# Patient Record
Sex: Female | Born: 1970 | Race: White | Hispanic: No | Marital: Single | Smoking: Never smoker
Health system: Southern US, Community
[De-identification: ages and names within clinical notes are randomized; demographics above are authoritative.]

## PROBLEM LIST (undated history)

## (undated) DIAGNOSIS — R87619 Unspecified abnormal cytological findings in specimens from cervix uteri: Secondary | ICD-10-CM

---
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
## 2014-01-17 DIAGNOSIS — R87619 Unspecified abnormal cytological findings in specimens from cervix uteri: Secondary | ICD-10-CM

## 2014-01-17 HISTORY — DX: Unspecified abnormal cytological findings in specimens from cervix uteri: R87.619

## 2018-06-20 DIAGNOSIS — M79672 Pain in left foot: Secondary | ICD-10-CM | POA: Diagnosis not present

## 2018-07-25 DIAGNOSIS — J3501 Chronic tonsillitis: Secondary | ICD-10-CM | POA: Insufficient documentation

## 2018-08-27 DIAGNOSIS — J3501 Chronic tonsillitis: Secondary | ICD-10-CM | POA: Diagnosis not present

## 2018-10-09 ENCOUNTER — Other Ambulatory Visit: Payer: Self-pay

## 2018-10-11 ENCOUNTER — Other Ambulatory Visit: Payer: Self-pay

## 2018-10-11 ENCOUNTER — Encounter: Payer: Self-pay | Admitting: Obstetrics & Gynecology

## 2018-10-11 ENCOUNTER — Ambulatory Visit: Payer: BC Managed Care – PPO | Admitting: Obstetrics & Gynecology

## 2018-10-11 ENCOUNTER — Ambulatory Visit
Admission: RE | Admit: 2018-10-11 | Discharge: 2018-10-11 | Disposition: A | Discharge status: Home / Self Care | Payer: BC Managed Care – PPO | Source: Ambulatory Visit | Attending: Obstetrics & Gynecology | Admitting: Obstetrics & Gynecology

## 2018-10-11 ENCOUNTER — Other Ambulatory Visit (HOSPITAL_COMMUNITY)
Admission: RE | Admit: 2018-10-11 | Discharge: 2018-10-11 | Disposition: A | Discharge status: Home / Self Care | Payer: BC Managed Care – PPO | Source: Ambulatory Visit | Attending: Obstetrics & Gynecology | Admitting: Obstetrics & Gynecology

## 2018-10-11 ENCOUNTER — Other Ambulatory Visit: Payer: Self-pay | Admitting: Obstetrics & Gynecology

## 2018-10-11 VITALS — BP 110/84 | HR 80 | Temp 98.0°F | Ht 64.0 in | Wt 145.0 lb

## 2018-10-11 DIAGNOSIS — Z01419 Encounter for gynecological examination (general) (routine) without abnormal findings: Secondary | ICD-10-CM | POA: Diagnosis not present

## 2018-10-11 DIAGNOSIS — Z1231 Encounter for screening mammogram for malignant neoplasm of breast: Secondary | ICD-10-CM

## 2018-10-11 DIAGNOSIS — B977 Papillomavirus as the cause of diseases classified elsewhere: Secondary | ICD-10-CM | POA: Diagnosis not present

## 2018-10-11 DIAGNOSIS — Z124 Encounter for screening for malignant neoplasm of cervix: Secondary | ICD-10-CM | POA: Diagnosis not present

## 2018-10-11 DIAGNOSIS — Z0000 Encounter for general adult medical examination without abnormal findings: Secondary | ICD-10-CM | POA: Diagnosis not present

## 2018-10-11 DIAGNOSIS — R7989 Other specified abnormal findings of blood chemistry: Secondary | ICD-10-CM

## 2018-10-11 MED ORDER — HYDROCORTISONE (PERIANAL) 2.5 % EX CREA: TOPICAL_CREAM | Freq: Four times a day (QID) | CUTANEOUS | 1 refills | Status: DC

## 2018-10-11 NOTE — Progress Notes (Signed)
48 y.o. [id number] Domestic Partner White or Caucasian female here for new patient annual exam.  Moved to [place] from [place].  Parents and siblings are here in [place].  Daughter lives in [place] and is a sophomore.    She is still having cycles but they are now about five weeks between cycles.  Flow is heavier on the first day and then tapers off very quickly.    Does have hemorrhoids.  Uses witch hazel.  Had a colonoscopy due to hemorrhoid related symptoms.    Patient's last menstrual period was 10/04/2018 (exact date).          Sexually active: No.  The current method of family planning is abstinence.    Exercising: Yes.    some Smoker:  no  Health Maintenance: Pap:  2018 Normal  History of abnormal Pap:  Yes, HPV + ~2016 MMG:  2016 Normal  Colonoscopy:  ~2017  hemorrhoids  BMD:   Never TDaP:  Current  Screening Labs: fasting today   reports that she has never smoked. She has never used smokeless tobacco. She reports current alcohol use. She reports that she does not use drugs.  Past Medical History:  Diagnosis Date  . 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 No pertinent surgical history.  No current outpatient medications on file.   No current facility-administered medications for this visit.     Family History  Problem Relation Age of Onset  . Heart disease Mother   . Hypertension Mother   . Thyroid disease Mother     Review of Systems  Genitourinary:       Breast/ axilla pain on and off   All other systems reviewed and are negative.   Exam:   BP 110/84   Pulse 80   Temp 98 F (36.7 C) (Temporal)   Ht 5\' 4"  (1.626 m)   Wt 145 lb (65.8 kg)   LMP 10/04/2018 (Exact Date)   BMI 24.89 kg/m   Height:   Height: 5\' 4"  (162.6 cm)  Ht Readings from Last 3 Encounters:  10/11/18 5\' 4"  (1.626 m)    General appearance: alert, cooperative and appears stated age Head: Normocephalic, without obvious abnormality,  atraumatic Neck: no adenopathy, supple, symmetrical, trachea midline and thyroid normal to inspection and palpation Lungs: clear to auscultation bilaterally Breasts: normal appearance, no masses or tenderness Heart: regular rate and rhythm Abdomen: soft, non-tender; bowel sounds normal; no masses,  no organomegaly Extremities: extremities normal, atraumatic, no cyanosis or edema Skin: Skin color, texture, turgor normal. No rashes or lesions Lymph nodes: Cervical, supraclavicular, and axillary nodes normal. No abnormal inguinal nodes palpated Neurologic: Grossly normal   Pelvic: External genitalia:  no lesions              Urethra:  normal appearing urethra with no masses, tenderness or lesions              Bartholins and Skenes: normal                 Vagina: normal appearing vagina with normal color and discharge, no lesions              Cervix: no lesions              Pap taken: Yes.   Bimanual Exam:  Uterus:  normal size, contour, position, consistency, mobility, non-tender  Adnexa: normal adnexa and no mass, fullness, tenderness               Rectovaginal: Confirms               Anus:  normal sphincter tone, no lesions  Chaperone was present for exam.  A:  Well Woman with normal exam Perimenopausal Hemorrhoids   P:   Mammogram guidelines reviewed.  She will schedule a 3D MMG. pap smear with HR HPV CBC, CMP, Lipids, TSH, and Vit D obtained today Anusol HC 2.5% prn hemorrhoid pain/swelling Return annually or prn

## 2018-10-12 LAB — LIPID PANEL
Chol/HDL Ratio: 2.2 ratio (ref 0.0–4.4)
Cholesterol, Total: 165 mg/dL (ref 100–199)
HDL: 76 mg/dL (ref >39)
LDL Chol Calc (NIH): 79 mg/dL (ref 0–99)
Triglycerides: 47 mg/dL (ref 0–149)
VLDL Cholesterol Cal: 10 mg/dL (ref 5–40)

## 2018-10-12 LAB — VITAMIN D 25 HYDROXY (VIT D DEFICIENCY, FRACTURES): Vit D, 25-Hydroxy: 30.4 ng/mL (ref 30.0–100.0)

## 2018-10-12 LAB — CBC
Hematocrit: 40 % (ref 34.0–46.6)
Hemoglobin: 13.2 g/dL (ref 11.1–15.9)
MCH: 30.9 pg (ref 26.6–33.0)
MCHC: 33 g/dL (ref 31.5–35.7)
MCV: 94 fL (ref 79–97)
Platelets: 269 10*3/uL (ref 150–450)
RBC: 4.27 x10E6/uL (ref 3.77–5.28)
RDW: 11.3 % — ABNORMAL LOW (ref 11.7–15.4)
WBC: 3.1 10*3/uL — ABNORMAL LOW (ref 3.4–10.8)

## 2018-10-12 LAB — COMPREHENSIVE METABOLIC PANEL
ALT: 8 IU/L (ref 0–32)
AST: 17 IU/L (ref 0–40)
Albumin/Globulin Ratio: 1.5 (ref 1.2–2.2)
Albumin: 4.4 g/dL (ref 3.8–4.8)
Alkaline Phosphatase: 42 IU/L (ref 39–117)
BUN/Creatinine Ratio: 13 (ref 9–23)
BUN: 10 mg/dL (ref 6–24)
Bilirubin Total: 0.4 mg/dL (ref 0.0–1.2)
CO2: 24 mmol/L (ref 20–29)
Calcium: 9.1 mg/dL (ref 8.7–10.2)
Chloride: 102 mmol/L (ref 96–106)
Creatinine, Ser: 0.8 mg/dL (ref 0.57–1.00)
GFR calc Af Amer: 102 mL/min/{1.73_m2} (ref >59)
GFR calc non Af Amer: 88 mL/min/{1.73_m2} (ref >59)
Globulin, Total: 2.9 g/dL (ref 1.5–4.5)
Glucose: 80 mg/dL (ref 65–99)
Potassium: 4.1 mmol/L (ref 3.5–5.2)
Sodium: 138 mmol/L (ref 134–144)
Total Protein: 7.3 g/dL (ref 6.0–8.5)

## 2018-10-12 LAB — TSH: TSH: 0.038 u[IU]/mL — ABNORMAL LOW (ref 0.450–4.500)

## 2018-10-18 LAB — CYTOLOGY - PAP
Adequacy: ABSENT
Diagnosis: NEGATIVE
High risk HPV: NEGATIVE
Molecular Disclaimer: 56
Molecular Disclaimer: NORMAL

## 2018-10-21 NOTE — Addendum Note (Signed)
Addended by: Megan Salon on: 10/21/2018 11:31 PM   Modules accepted: Orders

## 2018-10-22 ENCOUNTER — Telehealth: Payer: Self-pay | Admitting: Obstetrics & Gynecology

## 2018-10-22 ENCOUNTER — Other Ambulatory Visit: Payer: Self-pay | Admitting: Obstetrics & Gynecology

## 2018-10-22 DIAGNOSIS — N6310 Unspecified lump in the right breast, unspecified quadrant: Secondary | ICD-10-CM

## 2018-10-22 NOTE — Telephone Encounter (Signed)
Spoke to [place] at [facility]. Result is pending patients previous MMG records.  [facility] info on [facility] on 10-12-18. Requested Elizabeth contact patient directly regarding result and additional/previous image information needed.   Routing to provider. Encounter closed.

## 2018-10-22 NOTE — Telephone Encounter (Signed)
Call to patient. Patient states she had her MMG done on 10-11-2018 at the [facility]. Patient states no one has called her in regard to MMG results. Report in Epic only showing "exam ended." RN advised would need to contact [facility] to follow up on MMG. Patient agreeable.

## 2018-10-22 NOTE — Telephone Encounter (Signed)
Patient is calling regarding mammogram results.

## 2018-10-24 ENCOUNTER — Ambulatory Visit
Admission: RE | Admit: 2018-10-24 | Discharge: 2018-10-24 | Disposition: A | Discharge status: Home / Self Care | Payer: BC Managed Care – PPO | Source: Ambulatory Visit | Attending: Obstetrics & Gynecology | Admitting: Obstetrics & Gynecology

## 2018-10-24 ENCOUNTER — Other Ambulatory Visit: Payer: Self-pay

## 2018-10-24 DIAGNOSIS — N6310 Unspecified lump in the right breast, unspecified quadrant: Secondary | ICD-10-CM

## 2018-10-24 DIAGNOSIS — N644 Mastodynia: Secondary | ICD-10-CM | POA: Diagnosis not present

## 2018-10-24 LAB — SPECIMEN STATUS REPORT

## 2018-10-24 LAB — T4, FREE: Free T4: 1.44 ng/dL (ref 0.82–1.77)

## 2018-11-21 ENCOUNTER — Other Ambulatory Visit (INDEPENDENT_AMBULATORY_CARE_PROVIDER_SITE_OTHER): Payer: BC Managed Care – PPO

## 2018-11-21 ENCOUNTER — Other Ambulatory Visit: Payer: BC Managed Care – PPO

## 2018-11-21 ENCOUNTER — Other Ambulatory Visit: Payer: Self-pay

## 2018-11-21 DIAGNOSIS — R7989 Other specified abnormal findings of blood chemistry: Secondary | ICD-10-CM | POA: Diagnosis not present

## 2018-11-22 LAB — T4, FREE: Free T4: 1.05 ng/dL (ref 0.82–1.77)

## 2018-11-22 LAB — TSH: TSH: 3.45 u[IU]/mL (ref 0.450–4.500)

## 2018-11-22 LAB — T3: T3, Total: 109 ng/dL (ref 71–180)

## 2018-11-23 ENCOUNTER — Telehealth: Payer: Self-pay

## 2018-11-23 ENCOUNTER — Telehealth: Payer: Self-pay | Admitting: *Deleted

## 2018-11-23 NOTE — Telephone Encounter (Signed)
10/11/18: screening MMG at [facility] 10/24/18: [place] right axilla  RECOMMENDATION: Bilateral screening mammogram in 1 year.  Clinical follow-up recommended for the area of concern in the RIGHT axilla. Any further workup should be based on clinical grounds.  I have discussed the findings and causes of breast pain and recommendations with the patient. If applicable, a reminder letter will be sent to the patient regarding the next appointment.  BI-RADS CATEGORY  1: Negative.   Dr. Sabra Heck -ok to remove from [facility] hold?

## 2018-11-23 NOTE — Telephone Encounter (Signed)
-----   Message from Megan Salon, MD sent at 11/23/2018  3:51 PM EST ----- Please let pt know her repeat thyroid testing is all normal.  Does not need any medication.  I will repeat thyroid testing at next AEX to ensure stable.  Thanks.

## 2018-11-23 NOTE — Telephone Encounter (Signed)
Tried calling patient regarding results. No answer, left message for patient to call me back at [phone]. 

## 2018-11-23 NOTE — Telephone Encounter (Signed)
Yes remove from imaging hold.

## 2018-11-23 NOTE — Telephone Encounter (Signed)
Removed from MMG hold.   Encounter closed.  

## 2018-11-26 NOTE — Telephone Encounter (Signed)
Patient notified of the following results as seen below.

## 2019-01-29 DIAGNOSIS — R1033 Periumbilical pain: Secondary | ICD-10-CM | POA: Diagnosis not present

## 2019-07-23 DIAGNOSIS — F4320 Adjustment disorder, unspecified: Secondary | ICD-10-CM | POA: Diagnosis not present

## 2019-08-05 DIAGNOSIS — F4320 Adjustment disorder, unspecified: Secondary | ICD-10-CM | POA: Diagnosis not present

## 2019-11-12 DIAGNOSIS — D229 Melanocytic nevi, unspecified: Secondary | ICD-10-CM | POA: Diagnosis not present

## 2019-11-12 DIAGNOSIS — M79672 Pain in left foot: Secondary | ICD-10-CM | POA: Diagnosis not present

## 2019-12-11 ENCOUNTER — Ambulatory Visit (INDEPENDENT_AMBULATORY_CARE_PROVIDER_SITE_OTHER): Payer: BC Managed Care – PPO

## 2019-12-11 ENCOUNTER — Ambulatory Visit: Payer: BC Managed Care – PPO | Admitting: Podiatry

## 2019-12-11 ENCOUNTER — Other Ambulatory Visit: Payer: Self-pay

## 2019-12-11 DIAGNOSIS — M778 Other enthesopathies, not elsewhere classified: Secondary | ICD-10-CM

## 2019-12-11 DIAGNOSIS — L821 Other seborrheic keratosis: Secondary | ICD-10-CM | POA: Diagnosis not present

## 2019-12-11 DIAGNOSIS — D1720 Benign lipomatous neoplasm of skin and subcutaneous tissue of unspecified limb: Secondary | ICD-10-CM | POA: Diagnosis not present

## 2019-12-11 DIAGNOSIS — M79672 Pain in left foot: Secondary | ICD-10-CM

## 2019-12-11 DIAGNOSIS — L578 Other skin changes due to chronic exposure to nonionizing radiation: Secondary | ICD-10-CM | POA: Diagnosis not present

## 2019-12-11 DIAGNOSIS — D2262 Melanocytic nevi of left upper limb, including shoulder: Secondary | ICD-10-CM | POA: Diagnosis not present

## 2019-12-16 NOTE — Progress Notes (Signed)
49 y.o. [id number] Single White or Caucasian female here for annual exam.   Period Duration (Days): 3 Period Pattern: Regular Menstrual Flow: Light Menstrual Control: Maxi pad, Tampon Dysmenorrhea: None  Patient's last menstrual period was 12/04/2019 (exact date).    Denies problems, wants pap smear Moved to area after 26 years in [place]. One daughter in [place] in [place], one daughter in college at [employer].         Sexually active: No.  The current method of family planning is abstinence.    Exercising: No.  exercise Smoker:  no  Health Maintenance: Pap:  10-11-2018 neg HPV HR neg History of abnormal Pap:  yes MMG:  10-22-2018 category c density birads 1:neg & U/S done neg Colonoscopy:  ~2017 hemorrhoids BMD:   none TDaP:  UTD   Gardasil:   Not done Covid-19: not done Pneumonia vaccine(s):  Not done Shingrix:   n/a Hep C testing: not done Screening Labs: will drawn TSH and Cbc today   reports that she has never smoked. She has never used smokeless tobacco. She reports previous alcohol use. She reports that she does not use drugs.  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  No current outpatient medications on file.   No current facility-administered medications for this visit.    Family History  Problem Relation Age of Onset   Heart disease Mother    Hypertension Mother    Thyroid disease Mother     Review of Systems  Constitutional: Negative.   HENT: Negative.   Eyes: Negative.   Respiratory: Negative.   Cardiovascular: Negative.   Gastrointestinal: Negative.   Endocrine: Negative.   Genitourinary: Negative.   Musculoskeletal: Negative.   Skin: Negative.   Allergic/Immunologic: Negative.   Neurological: Negative.   Hematological: Negative.   Psychiatric/Behavioral: Negative.     Exam:   BP 102/68    Pulse 68    Resp 16    Wt 152 lb (68.9 kg)    LMP  12/04/2019 (Exact Date)    BMI 26.09 kg/m      General appearance: alert, cooperative and appears stated age Head: Normocephalic, without obvious abnormality, atraumatic Neck: no adenopathy, supple, symmetrical, trachea midline and thyroid normal to inspection and palpation Lungs: clear to auscultation bilaterally Breasts: normal appearance, no masses or tenderness, Normal to palpation without dominant masses, dense fibroglandular Heart: regular rate and rhythm Abdomen: soft, non-tender; bowel sounds normal; no masses,  no organomegaly Extremities: extremities normal except wearing boot on Left foot for tendenitis Skin: Skin color, texture, turgor normal. No rashes or lesions Lymph nodes: Cervical, supraclavicular, and axillary nodes normal. No abnormal inguinal nodes palpated Neurologic: Grossly normal   Pelvic: External genitalia:  no lesions              Urethra:  normal appearing urethra with no masses, tenderness or lesions              Bartholins and Skenes: normal                 Vagina: normal appearing vagina with normal color and discharge, no lesions              Cervix: no cervical motion tenderness and no lesions              Pap taken: Yes.   Bimanual Exam:  Uterus:  normal size,  contour, position, consistency, mobility, non-tender              Adnexa: no mass, fullness, tenderness               Rectovaginal: Confirms               Anus:  normal sphincter tone, no lesions  Shanon, CMA Chaperone was present for exam.  A:  Well Woman with normal exam  HPV positive 2016, wants retesting today  Abnormal TSH last year  P:   Mammogram, pt will call to schedule. Phone number provided  pap smear, collected today, pt advised that would recommend co-testing in 2023, but wants testing today  Labs: cbc, thyroid panel  F/u 1 year or sooner if needed

## 2019-12-17 ENCOUNTER — Encounter: Payer: Self-pay | Admitting: Podiatry

## 2019-12-17 NOTE — Progress Notes (Signed)
  Subjective:  Patient ID: Traci Harris, female    DOB: [birth year],  MRN: [id number]  Chief Complaint  Patient presents with  . Foot Pain    Left foot pain for about a year. It hurts to apply presure when she walks     49 y.o. female presents with the above complaint.  Patient presents with complaint of left foot pain that has been going on for quite some time.  Patient states been going on for about a years constantly paining pain with stepping on it.  She states that there is sometimes dull achy pain.  She has not tried anything for it.  She has not seen anyone else prior to see me.  She would like to get it evaluated.  She Denies any acute injury or trauma to the area.   Review of Systems: Negative except as noted in the HPI. Denies N/V/F/Ch.  Past Medical History:  Diagnosis Date  . Abnormal Pap smear of cervix 2016   HPV +     Current Outpatient Medications:  .  hydrocortisone (ANUSOL-HC) 2.5 % rectal cream, Place rectally 4 (four) times daily. Use as needed for hemorrhoid pain/swelling., Disp: 30 g, Rfl: 1  Social History   Tobacco Use  Smoking Status Never Smoker  Smokeless Tobacco Never Used    No Known Allergies Objective:  There were no vitals filed for this visit. There is no height or weight on file to calculate BMI. Constitutional Well developed. Well nourished.  Vascular Dorsalis pedis pulses palpable bilaterally. Posterior tibial pulses palpable bilaterally. Capillary refill normal to all digits.  No cyanosis or clubbing noted. Pedal hair growth normal.  Neurologic Normal speech. Oriented to person, place, and time. Epicritic sensation to light touch grossly present bilaterally.  Dermatologic Nails well groomed and normal in appearance. No open wounds. No skin lesions.  Orthopedic:  Pain on palpation to the dorsal midfoot.  Pain with dorsiflexion resisted with of the digits.  No pain with resisted plantarflexion of the digits.  Negative piano key test.   No concern for Lisfranc injury.   Radiographs: 3 views of skeletally mature adult left foot: No osteoarthritic changes noted.  Mild pes planovalgus deformity noted.  No other bony abnormalities identified.  No fractures noted. Assessment:   1. Foot pain, left    Plan:  Patient was evaluated and treated and all questions answered.  Left extensor tendinitis -I explained patient the etiology of tendinitis and various treatment options were discussed.  Given the amount of pain that she is having I believe patient will benefit from complete immobilization of the ankle to allow the soft tissue to heal appropriately.  This will also allow me to focalized her pain as well.  I discussed this with the patient extensive detail.  Patient states understanding. -Cam boot was dispensed to have asked her to work for next 4 weeks.  No follow-ups on file.

## 2019-12-19 ENCOUNTER — Encounter: Payer: Self-pay | Admitting: Nurse Practitioner

## 2019-12-19 ENCOUNTER — Ambulatory Visit: Payer: BC Managed Care – PPO | Admitting: Nurse Practitioner

## 2019-12-19 ENCOUNTER — Other Ambulatory Visit: Payer: Self-pay

## 2019-12-19 ENCOUNTER — Other Ambulatory Visit: Payer: Self-pay | Admitting: Nurse Practitioner

## 2019-12-19 ENCOUNTER — Other Ambulatory Visit (HOSPITAL_COMMUNITY)
Admission: RE | Admit: 2019-12-19 | Discharge: 2019-12-19 | Disposition: A | Discharge status: Home / Self Care | Payer: BC Managed Care – PPO | Source: Ambulatory Visit | Attending: Obstetrics and Gynecology | Admitting: Obstetrics and Gynecology

## 2019-12-19 VITALS — BP 102/68 | HR 68 | Resp 16 | Wt 152.0 lb

## 2019-12-19 DIAGNOSIS — Z01419 Encounter for gynecological examination (general) (routine) without abnormal findings: Secondary | ICD-10-CM

## 2019-12-19 DIAGNOSIS — R87810 Cervical high risk human papillomavirus (HPV) DNA test positive: Secondary | ICD-10-CM | POA: Insufficient documentation

## 2019-12-19 DIAGNOSIS — R7989 Other specified abnormal findings of blood chemistry: Secondary | ICD-10-CM

## 2019-12-19 DIAGNOSIS — Z1231 Encounter for screening mammogram for malignant neoplasm of breast: Secondary | ICD-10-CM

## 2019-12-19 NOTE — Patient Instructions (Signed)
Health Maintenance, Female Adopting a healthy lifestyle and getting preventive care are important in promoting health and wellness. Ask your health care provider about:  The right schedule for you to have regular tests and exams.  Things you can do on your own to prevent diseases and keep yourself healthy. What should I know about diet, weight, and exercise? Eat a healthy diet   Eat a diet that includes plenty of vegetables, fruits, low-fat dairy products, and lean protein.  Do not eat a lot of foods that are high in solid fats, added sugars, or sodium. Maintain a healthy weight Body mass index (BMI) is used to identify weight problems. It estimates body fat based on height and weight. Your health care provider can help determine your BMI and help you achieve or maintain a healthy weight. Get regular exercise Get regular exercise. This is one of the most important things you can do for your health. Most adults should:  Exercise for at least 150 minutes each week. The exercise should increase your heart rate and make you sweat (moderate-intensity exercise).  Do strengthening exercises at least twice a week. This is in addition to the moderate-intensity exercise.  Spend less time sitting. Even light physical activity can be beneficial. Watch cholesterol and blood lipids Have your blood tested for lipids and cholesterol at 48 years of age, then have this test every 5 years. Have your cholesterol levels checked more often if:  Your lipid or cholesterol levels are high.  You are older than 48 years of age.  You are at high risk for heart disease. What should I know about cancer screening? Depending on your health history and family history, you may need to have cancer screening at various ages. This may include screening for:  Breast cancer.  Cervical cancer.  Colorectal cancer.  Skin cancer.  Lung cancer. What should I know about heart disease, diabetes, and high blood  pressure? Blood pressure and heart disease  High blood pressure causes heart disease and increases the risk of stroke. This is more likely to develop in people who have high blood pressure readings, are of African descent, or are overweight.  Have your blood pressure checked: ? Every 3-5 years if you are 18-39 years of age. ? Every year if you are 40 years old or older. Diabetes Have regular diabetes screenings. This checks your fasting blood sugar level. Have the screening done:  Once every three years after age 40 if you are at a normal weight and have a low risk for diabetes.  More often and at a younger age if you are overweight or have a high risk for diabetes. What should I know about preventing infection? Hepatitis B If you have a higher risk for hepatitis B, you should be screened for this virus. Talk with your health care provider to find out if you are at risk for hepatitis B infection. Hepatitis C Testing is recommended for:  Everyone born from 1945 through 1965.  Anyone with known risk factors for hepatitis C. Sexually transmitted infections (STIs)  Get screened for STIs, including gonorrhea and chlamydia, if: ? You are sexually active and are younger than 48 years of age. ? You are older than 48 years of age and your health care provider tells you that you are at risk for this type of infection. ? Your sexual activity has changed since you were last screened, and you are at increased risk for chlamydia or gonorrhea. Ask your health care provider if   you are at risk.  Ask your health care provider about whether you are at high risk for HIV. Your health care provider may recommend a prescription medicine to help prevent HIV infection. If you choose to take medicine to prevent HIV, you should first get tested for HIV. You should then be tested every 3 months for as long as you are taking the medicine. Pregnancy  If you are about to stop having your period (premenopausal) and  you may become pregnant, seek counseling before you get pregnant.  Take 400 to 800 micrograms (mcg) of folic acid every day if you become pregnant.  Ask for birth control (contraception) if you want to prevent pregnancy. Osteoporosis and menopause Osteoporosis is a disease in which the bones lose minerals and strength with aging. This can result in bone fractures. If you are 65 years old or older, or if you are at risk for osteoporosis and fractures, ask your health care provider if you should:  Be screened for bone loss.  Take a calcium or vitamin D supplement to lower your risk of fractures.  Be given hormone replacement therapy (HRT) to treat symptoms of menopause. Follow these instructions at home: Lifestyle  Do not use any products that contain nicotine or tobacco, such as cigarettes, e-cigarettes, and chewing tobacco. If you need help quitting, ask your health care provider.  Do not use street drugs.  Do not share needles.  Ask your health care provider for help if you need support or information about quitting drugs. Alcohol use  Do not drink alcohol if: ? Your health care provider tells you not to drink. ? You are pregnant, may be pregnant, or are planning to become pregnant.  If you drink alcohol: ? Limit how much you use to 0-1 drink a day. ? Limit intake if you are breastfeeding.  Be aware of how much alcohol is in your drink. In the [place] one drink equals one 12 oz bottle of beer (355 mL), one 5 oz glass of wine (148 mL), or one 1 oz glass of hard liquor (44 mL). General instructions  Schedule regular health, dental, and eye exams.  Stay current with your vaccines.  Tell your health care provider if: ? You often feel depressed. ? You have ever been abused or do not feel safe at home. Summary  Adopting a healthy lifestyle and getting preventive care are important in promoting health and wellness.  Follow your health care provider's instructions about healthy  diet, exercising, and getting tested or screened for diseases.  Follow your health care provider's instructions on monitoring your cholesterol and blood pressure. This information is not intended to replace advice given to you by your health care provider. Make sure you discuss any questions you have with your health care provider. Document Revised: 12/27/2017 Document Reviewed: 12/27/2017 Elsevier Patient Education  2020 Elsevier Inc.  

## 2019-12-20 LAB — THYROID PANEL WITH TSH
Free Thyroxine Index: 2.1 (ref 1.2–4.9)
T3 Uptake Ratio: 29 % (ref 24–39)
T4, Total: 7.1 ug/dL (ref 4.5–12.0)
TSH: 1.72 u[IU]/mL (ref 0.450–4.500)

## 2019-12-20 LAB — CBC
Hematocrit: 40.4 % (ref 34.0–46.6)
Hemoglobin: 13.3 g/dL (ref 11.1–15.9)
MCH: 30.9 pg (ref 26.6–33.0)
MCHC: 32.9 g/dL (ref 31.5–35.7)
MCV: 94 fL (ref 79–97)
Platelets: 231 10*3/uL (ref 150–450)
RBC: 4.31 x10E6/uL (ref 3.77–5.28)
RDW: 11.4 % — ABNORMAL LOW (ref 11.7–15.4)
WBC: 5.5 10*3/uL (ref 3.4–10.8)

## 2019-12-24 LAB — CYTOLOGY - PAP
Comment: NEGATIVE
Diagnosis: NEGATIVE
Diagnosis: REACTIVE
High risk HPV: NEGATIVE

## 2020-01-08 ENCOUNTER — Ambulatory Visit: Payer: BC Managed Care – PPO | Admitting: Podiatry

## 2020-01-28 ENCOUNTER — Ambulatory Visit: Payer: BC Managed Care – PPO

## 2020-02-04 ENCOUNTER — Other Ambulatory Visit: Payer: BC Managed Care – PPO

## 2020-02-05 DIAGNOSIS — U071 COVID-19: Secondary | ICD-10-CM | POA: Diagnosis not present

## 2020-02-06 ENCOUNTER — Other Ambulatory Visit: Payer: BC Managed Care – PPO

## 2020-02-29 ENCOUNTER — Encounter (HOSPITAL_COMMUNITY): Payer: Self-pay | Admitting: Emergency Medicine

## 2020-02-29 ENCOUNTER — Other Ambulatory Visit: Payer: Self-pay

## 2020-02-29 ENCOUNTER — Emergency Department (HOSPITAL_COMMUNITY)
Admission: EM | Admit: 2020-02-29 | Discharge: 2020-02-29 | Disposition: A | Discharge status: Home / Self Care | Payer: BC Managed Care – PPO | Attending: Emergency Medicine | Admitting: Emergency Medicine

## 2020-02-29 DIAGNOSIS — Z7721 Contact with and (suspected) exposure to potentially hazardous body fluids: Secondary | ICD-10-CM | POA: Diagnosis not present

## 2020-02-29 DIAGNOSIS — W461XXA Contact with contaminated hypodermic needle, initial encounter: Secondary | ICD-10-CM | POA: Diagnosis not present

## 2020-02-29 DIAGNOSIS — S61239A Puncture wound without foreign body of unspecified finger without damage to nail, initial encounter: Secondary | ICD-10-CM

## 2020-02-29 DIAGNOSIS — S61431A Puncture wound without foreign body of right hand, initial encounter: Secondary | ICD-10-CM | POA: Diagnosis not present

## 2020-02-29 NOTE — ED Provider Notes (Signed)
[facility] EMERGENCY DEPARTMENT Provider Note   CSN: [id number] Arrival date & time: 02/29/20  1634     History Chief Complaint  Patient presents with  . Needle Stick from dog    Traci Harris is a 50 y.o. female.  Presented to ER after needlestick at work.  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  Dog was on vaccinated against rabies.  Dog was not exhibiting any signs or symptoms of rabies, acting normally.  States that this was a narrow gauge needle.  Inquiring about need for rabies treatment.  States her tetanus is up-to-date.  She does have contact information for the dog and owner.  She has not contacted animal control.  She has no ongoing pain, no significant erythema at site of injury.  HPI     Past Medical History:  Diagnosis Date  . Abnormal Pap smear of cervix 2016   HPV +     Patient Active Problem List   Diagnosis Date Noted  . Chronic tonsillitis 07/25/2018    History reviewed. No pertinent surgical history.   OB History    Gravida  2   Para  2   Term  2   Preterm      AB      Living  2     SAB      IAB      Ectopic      Multiple      Live Births  2           Family History  Problem Relation Age of Onset  . Heart disease Mother   . Hypertension Mother   . Thyroid disease Mother     Social History   Tobacco Use  . Smoking status: Never Smoker  . Smokeless tobacco: Never Used  Vaping Use  . Vaping Use: Never used  Substance Use Topics  . Alcohol use: Not Currently  . Drug use: Never    Home Medications Prior to Admission medications   Not on File    Allergies    Patient has no known allergies.  Review of Systems   Review of Systems  Skin: Positive for wound.  All other systems reviewed and are negative.   Physical Exam Updated Vital Signs BP 123/86 (BP Location: Left Arm)   Pulse 94   Temp 98 F (36.7 C)   Resp 15   SpO2  96%   Physical Exam Vitals and nursing note reviewed.  Constitutional:      General: She is not in acute distress.    Appearance: She is well-developed and well-nourished.  HENT:     Head: Normocephalic and atraumatic.  Eyes:     Conjunctiva/sclera: Conjunctivae normal.  Cardiovascular:     Rate and Rhythm: Normal rate.     Pulses: Normal pulses.  Pulmonary:     Effort: Pulmonary effort is normal. No respiratory distress.  Musculoskeletal:        General: No edema.     Cervical back: Neck supple.  Skin:    General: Skin is warm and dry.     Comments: There is a punctate small puncture wound (58mm diameter) over the hand, no induration, swelling or overlying erythema; normal radial pulse, normal sensation  Neurological:     Mental Status: She is alert.  Psychiatric:        Mood and Affect: Mood and affect and mood normal.  Behavior: Behavior normal.     ED Results / Procedures / Treatments   Labs (all labs ordered are listed, but only abnormal results are displayed) Labs Reviewed - No data to display  EKG None  Radiology No results found.  Procedures Procedures   Medications Ordered in ED Medications - No data to display  ED Course  I have reviewed the triage vital signs and the nursing notes.  Pertinent labs & imaging results that were available during my care of the patient were reviewed by me and considered in my medical decision making (see chart for details).    MDM Rules/Calculators/A&P                         50 year old lady who presents to ER after needlestick from needle that was used on a dog.  Reports tetanus up-to-date.  The needlestick is very small, no active bleeding and no overlying erythema or induration noted.  Regarding rabies prophylaxis, patient does have information for the dog and owner.  I recommended she contact animal control at [facility] where the incident happened to get assistance with monitoring of the dog.  Do not see  indication for initiating rabies vaccination and immunoglobulin schedule at this time given there is a high chance that this dog can be adequately monitored.   After the discussed management above, the patient was determined to be safe for discharge.  The patient was in agreement with this plan and all questions regarding their care were answered.  ED return precautions were discussed and the patient will return to the ED with any significant worsening of condition.   Final Clinical Impression(s) / ED Diagnoses Final diagnoses:  Hypodermic needlestick injury of finger of right hand    Rx / DC Orders ED Discharge Orders    None       Milagros Loll, MD 03/01/20 1155

## 2020-02-29 NOTE — Discharge Instructions (Signed)
Please contact [facility] animal control for assistance with monitoring of the dog.  The dog should be monitored for 10 days from your needlestick.  If the dog has signs or symptoms suggestive of rabies, you will need to be reassessed and treated.  Otherwise would just recommend follow-up with your primary doctor.  You do not need any treatment for prevention of [facility] spotted fever.

## 2020-02-29 NOTE — ED Notes (Signed)
Pt seen and discharged from triage by Dr. Stevie Kern.

## 2020-02-29 NOTE — ED Triage Notes (Signed)
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

## 2020-03-10 ENCOUNTER — Ambulatory Visit: Payer: BC Managed Care – PPO

## 2020-03-10 ENCOUNTER — Other Ambulatory Visit: Payer: Self-pay

## 2020-03-10 ENCOUNTER — Ambulatory Visit
Admission: RE | Admit: 2020-03-10 | Discharge: 2020-03-10 | Disposition: A | Discharge status: Home / Self Care | Payer: BC Managed Care – PPO | Source: Ambulatory Visit | Attending: Nurse Practitioner | Admitting: Nurse Practitioner

## 2020-03-10 DIAGNOSIS — Z1231 Encounter for screening mammogram for malignant neoplasm of breast: Secondary | ICD-10-CM

## 2020-03-13 NOTE — Progress Notes (Signed)
Normal mammogram

## 2020-08-11 ENCOUNTER — Encounter (HOSPITAL_COMMUNITY): Payer: Self-pay | Admitting: *Deleted

## 2020-08-11 ENCOUNTER — Other Ambulatory Visit: Payer: Self-pay

## 2020-08-11 ENCOUNTER — Ambulatory Visit (HOSPITAL_COMMUNITY)
Admission: EM | Admit: 2020-08-11 | Discharge: 2020-08-11 | Disposition: A | Discharge status: Home / Self Care | Payer: BC Managed Care – PPO

## 2020-08-11 DIAGNOSIS — W460XXA Contact with hypodermic needle, initial encounter: Secondary | ICD-10-CM | POA: Diagnosis not present

## 2020-08-11 DIAGNOSIS — Z7721 Contact with and (suspected) exposure to potentially hazardous body fluids: Secondary | ICD-10-CM | POA: Diagnosis not present

## 2020-08-11 NOTE — Discharge Instructions (Addendum)
Please monitor needlestick site for increased redness, swelling, pus.  Please follow-up if these symptoms develop.

## 2020-08-11 NOTE — ED Provider Notes (Signed)
[facility]    CSN: [id number] Arrival date & time: 08/11/20  1534      History   Chief Complaint Chief Complaint  Patient presents with   Body Fluid Exposure    HPI Traci Harris is a 50 y.o. female.   Patient presents with needlestick to right index finger that occurred today at of veterinarian office.  Patient states that she was drawing blood to check for heart worms in a dog when she stuck herself with a needle accidentally.  Last tetanus vaccine was in 2018.  Patient states that dog was up-to-date on all vaccines including rabies.  Patient presents today because she is concerned about needing to take prophylaxis antibiotics for needlestick.  Patient denies any known tickborne illness in dog where she had needle exposure from.    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 No pertinent surgical history.  OB History     Gravida  2   Para  2   Term  2   Preterm      AB      Living  2      SAB      IAB      Ectopic      Multiple      Live Births  2            Home Medications    Prior to Admission medications   Not on File    Family History Family History  Problem Relation Age of Onset   Heart disease Mother    Hypertension Mother    Thyroid disease Mother     Social History Social History   Tobacco Use   Smoking status: Never   Smokeless tobacco: Never  Vaping Use   Vaping Use: Never used  Substance Use Topics   Alcohol use: Not Currently   Drug use: Never     Allergies   Patient has no known allergies.   Review of Systems Review of Systems Per HPI  Physical Exam Triage Vital Signs ED Triage Vitals [08/11/20 1610]  Enc Vitals Group     BP 117/89     Pulse Rate 88     Resp 18     Temp 98.6 F (37 C)     Temp Source Oral      SpO2 100 %     Weight      Height      Head Circumference      Peak Flow      Pain Score      Pain Loc      Pain Edu?      Excl. in GC?    No data found.  Updated Vital Signs BP 117/89   Pulse 88   Temp 98.6 F (37 C) (Oral)   Resp 18   LMP 08/10/2020   SpO2 100%   Visual Acuity Right Eye Distance:   Left Eye Distance:   Bilateral Distance:    Right Eye Near:   Left Eye Near:    Bilateral Near:     Physical Exam Constitutional:      General: She is not in acute distress.    Appearance: Normal appearance.  HENT:     Head: Normocephalic and atraumatic.  Eyes:  Extraocular Movements: Extraocular movements intact.     Conjunctiva/sclera: Conjunctivae normal.  Pulmonary:     Effort: Pulmonary effort is normal.  Skin:    General: Skin is warm and dry.     Comments: No wound or erythema noted to right index finger where needlestick occurred.  Neurological:     General: No focal deficit present.     Mental Status: She is alert and oriented to person, place, and time. Mental status is at baseline.  Psychiatric:        Mood and Affect: Mood normal.        Behavior: Behavior normal.        Thought Content: Thought content normal.        Judgment: Judgment normal.     UC Treatments / Results  Labs (all labs ordered are listed, but only abnormal results are displayed) Labs Reviewed - No data to display  EKG   Radiology No results found.  Procedures Procedures (including critical care time)  Medications Ordered in UC Medications - No data to display  Initial Impression / Assessment and Plan / UC Course  I have reviewed the triage vital signs and the nursing notes.  Pertinent labs & imaging results that were available during my care of the patient were reviewed by me and considered in my medical decision making (see chart for details).     No visible puncture wound present.  No antibiotic prophylaxis needed at this time.  Patient to monitor  needlestick site closely for any signs of infection and to follow-up if these occur.  Patient needs up-to-date tetanus vaccine but refused after education was provided.  Patient voiced understanding.  Dog was up-to-date on all vaccines in the past year or 2 per patient, so do not see the need to initiate rabies vaccine.Discussed strict return precautions. Patient verbalized understanding and is agreeable with plan.  Final Clinical Impressions(s) / UC Diagnoses   Final diagnoses:  Accidental hypodermic needlestick injury with exposure to body fluid     Discharge Instructions      Please monitor needlestick site for increased redness, swelling, pus.  Please follow-up if these symptoms develop.     ED Prescriptions   None    PDMP not reviewed this encounter.   Lance Muss, FNP 08/11/20 [phone]

## 2020-08-11 NOTE — ED Triage Notes (Addendum)
Pt reports needle stick . Pt works at [employer] .

## 2020-12-21 ENCOUNTER — Ambulatory Visit: Payer: BC Managed Care – PPO | Admitting: Nurse Practitioner

## 2021-01-27 ENCOUNTER — Other Ambulatory Visit (HOSPITAL_COMMUNITY)
Admission: RE | Admit: 2021-01-27 | Discharge: 2021-01-27 | Disposition: A | Discharge status: Home / Self Care | Payer: BC Managed Care – PPO | Source: Ambulatory Visit | Attending: Obstetrics & Gynecology | Admitting: Obstetrics & Gynecology

## 2021-01-27 ENCOUNTER — Other Ambulatory Visit: Payer: Self-pay

## 2021-01-27 ENCOUNTER — Encounter (HOSPITAL_BASED_OUTPATIENT_CLINIC_OR_DEPARTMENT_OTHER): Payer: Self-pay | Admitting: Obstetrics & Gynecology

## 2021-01-27 ENCOUNTER — Ambulatory Visit (INDEPENDENT_AMBULATORY_CARE_PROVIDER_SITE_OTHER): Payer: BC Managed Care – PPO | Admitting: Obstetrics & Gynecology

## 2021-01-27 VITALS — BP 123/89 | HR 74 | Ht 64.0 in | Wt 146.4 lb

## 2021-01-27 DIAGNOSIS — Z01419 Encounter for gynecological examination (general) (routine) without abnormal findings: Secondary | ICD-10-CM | POA: Diagnosis not present

## 2021-01-27 DIAGNOSIS — Z124 Encounter for screening for malignant neoplasm of cervix: Secondary | ICD-10-CM | POA: Diagnosis not present

## 2021-01-27 DIAGNOSIS — Z1211 Encounter for screening for malignant neoplasm of colon: Secondary | ICD-10-CM | POA: Diagnosis not present

## 2021-01-27 DIAGNOSIS — Z8619 Personal history of other infectious and parasitic diseases: Secondary | ICD-10-CM | POA: Diagnosis not present

## 2021-01-27 DIAGNOSIS — Z1231 Encounter for screening mammogram for malignant neoplasm of breast: Secondary | ICD-10-CM

## 2021-01-27 NOTE — Progress Notes (Signed)
51 y.o. [id number] Domestric partner White or Caucasian female here for annual exam.  Cycles are changing.  Cycles have gotten shorter and there is more time between her cycles.    Pt thinks LMP was in December.     Sexually active: Yes.    Exercising: Yes.     Smoker:  no  Health Maintenance: Pap:  12/19/2019 Negative, neg HR HPV History of abnormal Pap:  h/o HPV MMG:  03/10/2020 Negative Colonoscopy:  2016 BMD:   not indicated Screening Labs: with PCP   reports that she has never smoked. She has never used smokeless tobacco. She reports that she does not currently use alcohol. She reports that she does not use drugs.  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  No current outpatient medications on file.   No current facility-administered medications for this visit.    Family History  Problem Relation Age of Onset   Heart disease Mother    Hypertension Mother    Thyroid disease Mother     Review of Systems  All other systems reviewed and are negative.  Exam:   BP 123/89 (BP Location: Left Arm, Patient Position: Sitting, Cuff Size: Normal)    Pulse 74    Ht 5\' 4"  (1.626 m) Comment: reported   Wt 146 lb 6.4 oz (66.4 kg)    BMI 25.13 kg/m   Height: 5\' 4"  (162.6 cm) (reported)  General appearance: alert, cooperative and appears stated age Head: Normocephalic, without obvious abnormality, atraumatic Neck: no adenopathy, supple, symmetrical, trachea midline and thyroid normal to inspection and palpation Lungs: clear to auscultation bilaterally Breasts: normal appearance, no masses or tenderness Heart: regular rate and rhythm Abdomen: soft, non-tender; bowel sounds normal; no masses,  no organomegaly Extremities: extremities normal, atraumatic, no cyanosis or edema Skin: Skin color, texture, turgor normal. No rashes or lesions Lymph nodes: Cervical, supraclavicular, and axillary nodes normal. No  abnormal inguinal nodes palpated Neurologic: Grossly normal   Pelvic: External genitalia:  no lesions              Urethra:  normal appearing urethra with no masses, tenderness or lesions              Bartholins and Skenes: normal                 Vagina: normal appearing vagina with normal color and no discharge, no lesions              Cervix: no lesions              Pap taken: Yes.   Bimanual Exam:  Uterus:  normal size, contour, position, consistency, mobility, non-tender              Adnexa: normal adnexa and no mass, fullness, tenderness               Rectovaginal: Confirms               Anus:  normal sphincter tone, no lesions  Chaperone, , CMA, was present for exam.  Assessment/Plan: 1. Well woman exam with routine gynecological exam - pap and HR HPV obtained today per pt request - MMG ordered - referral to GI placed - plan blood work next year  2. Encounter for screening mammogram for malignant neoplasm of breast - MM 3D SCREEN BREAST BILATERAL; Future  3. Colon  cancer screening - Ambulatory referral to Gastroenterology  4.  History of HPV infection

## 2021-01-29 LAB — CYTOLOGY - PAP
Comment: NEGATIVE
Diagnosis: NEGATIVE
High risk HPV: NEGATIVE

## 2021-02-02 DIAGNOSIS — U099 Post covid-19 condition, unspecified: Secondary | ICD-10-CM | POA: Diagnosis not present

## 2021-02-02 DIAGNOSIS — Z1329 Encounter for screening for other suspected endocrine disorder: Secondary | ICD-10-CM | POA: Diagnosis not present

## 2021-02-02 DIAGNOSIS — L659 Nonscarring hair loss, unspecified: Secondary | ICD-10-CM | POA: Diagnosis not present

## 2021-02-02 DIAGNOSIS — E538 Deficiency of other specified B group vitamins: Secondary | ICD-10-CM | POA: Diagnosis not present

## 2021-02-02 DIAGNOSIS — E559 Vitamin D deficiency, unspecified: Secondary | ICD-10-CM | POA: Diagnosis not present

## 2021-02-02 DIAGNOSIS — M2550 Pain in unspecified joint: Secondary | ICD-10-CM | POA: Diagnosis not present

## 2021-02-02 DIAGNOSIS — N951 Menopausal and female climacteric states: Secondary | ICD-10-CM | POA: Diagnosis not present

## 2021-02-02 DIAGNOSIS — Z131 Encounter for screening for diabetes mellitus: Secondary | ICD-10-CM | POA: Diagnosis not present

## 2021-02-02 DIAGNOSIS — R6889 Other general symptoms and signs: Secondary | ICD-10-CM | POA: Diagnosis not present

## 2021-02-19 DIAGNOSIS — N951 Menopausal and female climacteric states: Secondary | ICD-10-CM | POA: Diagnosis not present

## 2021-02-26 ENCOUNTER — Encounter: Payer: Self-pay | Admitting: Obstetrics & Gynecology

## 2021-03-05 DIAGNOSIS — L659 Nonscarring hair loss, unspecified: Secondary | ICD-10-CM | POA: Diagnosis not present

## 2021-03-05 DIAGNOSIS — N951 Menopausal and female climacteric states: Secondary | ICD-10-CM | POA: Diagnosis not present

## 2021-03-05 DIAGNOSIS — E559 Vitamin D deficiency, unspecified: Secondary | ICD-10-CM | POA: Diagnosis not present

## 2021-03-05 DIAGNOSIS — M2550 Pain in unspecified joint: Secondary | ICD-10-CM | POA: Diagnosis not present

## 2021-03-18 DIAGNOSIS — L820 Inflamed seborrheic keratosis: Secondary | ICD-10-CM | POA: Diagnosis not present

## 2021-03-26 ENCOUNTER — Ambulatory Visit
Admission: RE | Admit: 2021-03-26 | Discharge: 2021-03-26 | Disposition: A | Discharge status: Home / Self Care | Payer: PRIVATE HEALTH INSURANCE | Source: Ambulatory Visit | Attending: Obstetrics & Gynecology | Admitting: Obstetrics & Gynecology

## 2021-03-26 DIAGNOSIS — Z1231 Encounter for screening mammogram for malignant neoplasm of breast: Secondary | ICD-10-CM | POA: Diagnosis not present

## 2021-07-27 DIAGNOSIS — S50312A Abrasion of left elbow, initial encounter: Secondary | ICD-10-CM | POA: Diagnosis not present

## 2021-10-04 ENCOUNTER — Encounter: Payer: Self-pay | Admitting: Gastroenterology

## 2021-10-18 ENCOUNTER — Ambulatory Visit (INDEPENDENT_AMBULATORY_CARE_PROVIDER_SITE_OTHER): Payer: BC Managed Care – PPO | Admitting: Obstetrics & Gynecology

## 2021-10-18 ENCOUNTER — Encounter (HOSPITAL_BASED_OUTPATIENT_CLINIC_OR_DEPARTMENT_OTHER): Payer: Self-pay | Admitting: Obstetrics & Gynecology

## 2021-10-18 ENCOUNTER — Other Ambulatory Visit (HOSPITAL_COMMUNITY)
Admission: RE | Admit: 2021-10-18 | Discharge: 2021-10-18 | Disposition: A | Discharge status: Home / Self Care | Payer: BC Managed Care – PPO | Source: Ambulatory Visit | Attending: Obstetrics & Gynecology | Admitting: Obstetrics & Gynecology

## 2021-10-18 VITALS — BP 110/86 | HR 68 | Ht 64.0 in | Wt 129.0 lb

## 2021-10-18 DIAGNOSIS — K649 Unspecified hemorrhoids: Secondary | ICD-10-CM | POA: Diagnosis not present

## 2021-10-18 DIAGNOSIS — N9089 Other specified noninflammatory disorders of vulva and perineum: Secondary | ICD-10-CM | POA: Diagnosis not present

## 2021-10-18 DIAGNOSIS — Z113 Encounter for screening for infections with a predominantly sexual mode of transmission: Secondary | ICD-10-CM | POA: Diagnosis not present

## 2021-10-18 MED ORDER — HYDROCORTISONE (PERIANAL) 2.5 % EX CREA: TOPICAL_CREAM | Freq: Four times a day (QID) | CUTANEOUS | 1 refills | Status: DC

## 2021-10-18 NOTE — Progress Notes (Signed)
GYNECOLOGY  VISIT  CC:   vulvar lesion, hemorrhoid  HPI: 51 y.o. [id number] Single White or Caucasian female here for complaint of vulvar lesion.  She has a small area/bump that she's recently noticed and wanted this checked.  Denies pain or abnormal vaginal discharge.  Also, would like STD screening today.  Has questions about hemorrhoid treatment.  Options discussed.  May consider banding.  Has appt prior to colonoscopy scheduled and will discuss then.   Past Medical History:  Diagnosis Date   Abnormal Pap smear of cervix 2016   HPV +     MEDS:   No current outpatient medications on file prior to visit.   No current facility-administered medications on file prior to visit.    ALLERGIES: Patient has no known allergies.  SH:  single, non smoker  Review of Systems  Constitutional: Negative.     PHYSICAL EXAMINATION:    BP 110/86 (BP Location: Right Arm, Patient Position: Sitting, Cuff Size: Normal)   Pulse 68   Ht 5\' 4"  (1.626 m) Comment: reported  Wt 129 lb (58.5 kg)   LMP 08/11/2021 (Approximate)   BMI 22.14 kg/m     General appearance: alert, cooperative and appears stated age Lymph:  no inguinal LAD noted  Pelvic: External genitalia:  small sebaceous cyst on left labia majora noted              Urethra:  normal appearing urethra with no masses, tenderness or lesions              Bartholins and Skenes: normal                 Vagina: normal appearing vagina with normal color and discharge, no lesions              Anus:  no lesions  Chaperone, Octaviano Batty, CMA, was present for exam.  Assessment/Plan: 1. Vulvar lesion - pt reassured this is a benign finding  2. Screening examination for STD (sexually transmitted disease) - Cervicovaginal ancillary only( [place]) - RPR+HBsAg+HIV - Hepatitis C antibody  3. Hemorrhoids, unspecified hemorrhoid type - hydrocortisone (ANUSOL-HC) 2.5 % rectal cream; Place rectally 4 (four) times daily. Use as needed with  hemorrhoids.  Do not use for more than 5 days at a time.  Dispense: 30 g; Refill: 1 .

## 2021-10-19 LAB — RPR+HBSAG+HIV
HIV Screen 4th Generation wRfx: NONREACTIVE
Hepatitis B Surface Ag: NEGATIVE
RPR Ser Ql: NONREACTIVE

## 2021-10-19 LAB — CERVICOVAGINAL ANCILLARY ONLY
Chlamydia: NEGATIVE
Comment: NEGATIVE
Comment: NEGATIVE
Comment: NORMAL
Neisseria Gonorrhea: NEGATIVE
Trichomonas: NEGATIVE

## 2021-10-19 LAB — HEPATITIS C ANTIBODY: Hep C Virus Ab: NONREACTIVE

## 2021-10-22 ENCOUNTER — Ambulatory Visit (HOSPITAL_BASED_OUTPATIENT_CLINIC_OR_DEPARTMENT_OTHER): Payer: PRIVATE HEALTH INSURANCE | Admitting: Obstetrics & Gynecology

## 2021-11-11 ENCOUNTER — Ambulatory Visit: Payer: BC Managed Care – PPO | Admitting: Gastroenterology

## 2021-11-11 ENCOUNTER — Encounter: Payer: Self-pay | Admitting: Gastroenterology

## 2021-11-11 VITALS — BP 118/72 | HR 78 | Ht 64.0 in | Wt 126.0 lb

## 2021-11-11 DIAGNOSIS — Z1211 Encounter for screening for malignant neoplasm of colon: Secondary | ICD-10-CM | POA: Insufficient documentation

## 2021-11-11 DIAGNOSIS — K649 Unspecified hemorrhoids: Secondary | ICD-10-CM | POA: Diagnosis not present

## 2021-11-11 MED ORDER — NA SULFATE-K SULFATE-MG SULF 17.5-3.13-1.6 GM/177ML PO SOLN: 1.0000 | Freq: Once | ORAL | 0 refills | Status: DC

## 2021-11-11 NOTE — Progress Notes (Signed)
     11/11/2021 Traci Harris [id number] 20-Dec-1970   HISTORY OF PRESENT ILLNESS: This is a 51 year old female who is new to our office.  She was referred here by her PCP, Dr. Hale Bogus, to schedule screening colonoscopy.  She says that she had one in [place] and that was done for evaluation of rectal bleeding at that time.  We did end up obtaining the records after her visit and that study was performed in May 2018 that showed a single thrombosed external hemorrhoid, but was otherwise normal and repeat was recommended at 10-year interval.  She describes what sounds like hemorrhoids prolapsing when she will have a bowel movement and she is able to push it back up inside.  She is interested in hemorrhoid banding.   Past Medical History:  Diagnosis Date   Abnormal Pap smear of cervix 2016   HPV +    History reviewed. No pertinent surgical history.  reports that she has never smoked. She has never used smokeless tobacco. She reports that she does not currently use alcohol. She reports that she does not use drugs. family history includes Heart disease in her mother; Hypertension in her mother; Thyroid disease in her mother. No Known Allergies    Outpatient Encounter Medications as of 11/11/2021  Medication Sig   Multiple Vitamin (MULTIVITAMIN) tablet Take 1 tablet by mouth daily.   hydrocortisone (ANUSOL-HC) 2.5 % rectal cream Place rectally 4 (four) times daily. Use as needed with hemorrhoids.  Do not use for more than 5 days at a time. (Patient not taking: Reported on 11/11/2021)   No facility-administered encounter medications on file as of 11/11/2021.     REVIEW OF SYSTEMS  : All other systems reviewed and negative except where noted in the History of Present Illness.   PHYSICAL EXAM: BP 118/72   Pulse 78   Ht 5\' 4"  (1.626 m)   Wt 126 lb (57.2 kg)   LMP 08/11/2021 (Approximate)   BMI 21.63 kg/m  General: Well developed white female in no acute distress Head: Normocephalic  and atraumatic Eyes:  Sclerae anicteric, conjunctiva pink. Ears: Normal auditory acuity Lungs: Clear throughout to auscultation; no W/R/R. Heart: Regular rate and rhythm; no M/R/G. Abdomen: Soft, non-distended.  BS present.  Non-tender. Musculoskeletal: Symmetrical with no gross deformities  Skin: No lesions on visible extremities Extremities: No edema  Neurological: Alert oriented x 4, grossly non-focal Psychological:  Alert and cooperative. Normal mood and affect  ASSESSMENT AND PLAN: *Hemorrhoids: Single thrombosed external hemorrhoids seen on her colonoscopy in May 2018.  What she describes today sounds like internal hemorrhoids prolapsing.  Initially we had scheduled her for colonoscopy as we were unsuccessful in obtaining her records from her previous colonoscopy in [place].  After her visit she was able to speak with someone directly who agreed to fax them over right away.  Dr. Loletha Carrow reviewed colonoscopy report and said that she does not need colonoscopy for screening purposes.  He was agreeable to just see her back for hemorrhoid banding and examine her with anoscopy at that time.  He will proceed with hemorrhoid banding if felt appropriate then.  Plans for colonoscopy were cancelled.   CC:  Josefine Class* CC:  Dr. Hale Bogus

## 2021-11-11 NOTE — Patient Instructions (Addendum)
We have scheduled your hemorrhoid banding with Dr. Loletha Carrow on 11/22/21 at 3:40pm.  The [place] [facility] providers would like to encourage you to use [facility] to communicate with providers for non-urgent requests or questions.  Due to long hold times on the telephone, sending your provider a message by [facility] may be a faster and more efficient way to get a response.  Please allow 48 business hours for a response.  Please remember that this is for non-urgent requests.   Due to recent changes in healthcare laws, you may see the results of your imaging and laboratory studies on MyChart before your provider has had a chance to review them.  We understand that in some cases there may be results that are confusing or concerning to you. Not all laboratory results come back in the same time frame and the provider may be waiting for multiple results in order to interpret others.  Please give [place] 48 hours in order for your provider to thoroughly review all the results before contacting the office for clarification of your results.

## 2021-11-12 NOTE — Progress Notes (Signed)
____________________________________________________________  Attending physician addendum:  Thank you for sending this case to me and for discussing it with me during the clinic session. I have reviewed the entire note and agree with the plan.  Please double check the date on her last colonoscopy, and if it was May 2018, then please place a recall with [place] for May 2028.  I will examine her including an anoscopy at the upcoming office visit and perform banding if appropriate.  Wilfrid Lund, MD  ____________________________________________________________

## 2021-11-22 ENCOUNTER — Ambulatory Visit (INDEPENDENT_AMBULATORY_CARE_PROVIDER_SITE_OTHER): Payer: BC Managed Care – PPO | Admitting: Gastroenterology

## 2021-11-22 ENCOUNTER — Encounter: Payer: Self-pay | Admitting: Gastroenterology

## 2021-11-22 VITALS — BP 120/68 | HR 53 | Ht 64.0 in | Wt 130.0 lb

## 2021-11-22 DIAGNOSIS — K648 Other hemorrhoids: Secondary | ICD-10-CM | POA: Diagnosis not present

## 2021-11-22 NOTE — Progress Notes (Signed)
PROCEDURE NOTE: The patient presents with symptomatic grade 2  hemorrhoids, requesting rubber band ligation of his/her hemorrhoidal disease.  All risks, benefits and alternative forms of therapy were described and informed consent was obtained.  DRE revealed: small, firm posterior external HR Anoscopy:  Int HR, RP and RA > LL   The anorectum was pre-medicated with 0.125% NTG and lubricant. The decision was made to band the RP internal hemorrhoids, and the [place] was used to perform band ligation without complication.  Digital anorectal examination was then performed to assure proper positioning of the band, and to adjust the banded tissue as required.  The patient was discharged home without pain or other issues.  Dietary and behavioral recommendations were given and along with follow-up instructions.     The following adjunctive treatments were recommended:  none  The patient will return 2-3 weeks  for  follow-up and possible additional banding as required. No complications were encountered and the patient tolerated the procedure well.  Wilfrid Lund, MD

## 2021-11-22 NOTE — Patient Instructions (Signed)
_______________________________________________________  If you are age 50 or older, your body mass index should be between 23-30. Your Body mass index is 22.31 kg/m. If this is out of the aforementioned range listed, please consider follow up with your Primary Care Provider.  If you are age 29 or younger, your body mass index should be between 19-25. Your Body mass index is 22.31 kg/m. If this is out of the aformentioned range listed, please consider follow up with your Primary Care Provider.   ________________________________________________________  The [place] [facility] providers would like to encourage you to use [facility] to communicate with providers for non-urgent requests or questions.  Due to long hold times on the telephone, sending your provider a message by [facility] may be a faster and more efficient way to get a response.  Please allow 48 business hours for a response.  Please remember that this is for non-urgent requests.  _______________________________________________________  [facility] PROCEDURE    FOLLOW-UP CARE   The procedure you have had should have been relatively painless since the banding of the area involved does not have nerve endings and there is no pain sensation.  The rubber band cuts off the blood supply to the hemorrhoid and the band may fall off as soon as 48 hours after the banding (the band may occasionally be seen in the toilet bowl following a bowel movement). You may notice a temporary feeling of fullness in the rectum which should respond adequately to plain Tylenol or Motrin.  Following the banding, avoid strenuous exercise that evening and resume full activity the next day.  A sitz bath (soaking in a warm tub) or bidet is soothing, and can be useful for cleansing the area after bowel movements.     To avoid constipation, take two tablespoons of natural wheat bran, natural oat bran, flax, Benefiber or any over the counter fiber supplement and increase  your water intake to 7-8 glasses daily.    Unless you have been prescribed anorectal medication, do not put anything inside your rectum for two weeks: No suppositories, enemas, fingers, etc.  Occasionally, you may have more bleeding than usual after the banding procedure.  This is often from the untreated hemorrhoids rather than the treated one.  Don't be concerned if there is a tablespoon or so of blood.  If there is more blood than this, lie flat with your bottom higher than your head and apply an ice pack to the area. If the bleeding does not stop within a half an hour or if you feel faint, call our office at [phone] or go to the emergency room.  Problems are not common; however, if there is a substantial amount of bleeding, severe pain, chills, fever or difficulty passing urine (very rare) or other problems, you should call us at [phone] or report to the nearest emergency room.  Do not stay seated continuously for more than 2-3 hours for a day or two after the procedure.  Tighten your buttock muscles 10-15 times every two hours and take 10-15 deep breaths every 1-2 hours.  Do not spend more than a few minutes on the toilet if you cannot empty your bowel; instead re-visit the toilet at a later time.     It was a pleasure to see you today!  Thank you for trusting me with your gastrointestinal care!

## 2021-11-29 ENCOUNTER — Encounter: Payer: PRIVATE HEALTH INSURANCE | Admitting: Gastroenterology

## 2021-12-14 ENCOUNTER — Encounter: Payer: PRIVATE HEALTH INSURANCE | Admitting: Gastroenterology

## 2021-12-20 ENCOUNTER — Encounter: Payer: Self-pay | Admitting: Gastroenterology

## 2021-12-20 ENCOUNTER — Ambulatory Visit: Payer: BC Managed Care – PPO | Admitting: Gastroenterology

## 2021-12-20 VITALS — BP 100/60 | HR 67 | Ht 64.0 in | Wt 132.5 lb

## 2021-12-20 DIAGNOSIS — K648 Other hemorrhoids: Secondary | ICD-10-CM | POA: Diagnosis not present

## 2021-12-20 NOTE — Progress Notes (Signed)
  Pain for a day after last banding.  Since then, no bleeding, but still some feeling of prolapse.   PROCEDURE NOTE:   Elon Spanner, MA  present for entire procedure) The patient presents with symptomatic grade 2  hemorrhoids, requesting rubber band ligation of his/her hemorrhoidal disease.  All risks, benefits and alternative forms of therapy were described and informed consent was obtained.  DRE revealed: nml   The anorectum was pre-medicated with 0.125% NTG and lubricant. The decision was made to band the RA internal hemorrhoids, and the [facility] was used to perform band ligation without complication.  Digital anorectal examination was then performed to assure proper positioning of the band, and to adjust the banded tissue as required.  The patient was discharged home without pain or other issues.  Dietary and behavioral recommendations were given and along with follow-up instructions.     The following adjunctive treatments were recommended:  none  The patient will return prn  for  follow-up and possible additional banding as required. No complications were encountered and the patient tolerated the procedure well.  Amada Jupiter, MD

## 2021-12-20 NOTE — Patient Instructions (Signed)
_______________________________________________________  If you are age 50 or older, your body mass index should be between 23-30. Your Body mass index is 22.74 kg/m. If this is out of the aforementioned range listed, please consider follow up with your Primary Care Provider.  If you are age 68 or younger, your body mass index should be between 19-25. Your Body mass index is 22.74 kg/m. If this is out of the aformentioned range listed, please consider follow up with your Primary Care Provider.   ________________________________________________________  The [place] [facility] providers would like to encourage you to use [facility] to communicate with providers for non-urgent requests or questions.  Due to long hold times on the telephone, sending your provider a message by [facility] may be a faster and more efficient way to get a response.  Please allow 48 business hours for a response.  Please remember that this is for non-urgent requests.  _______________________________________________________  [place] PROCEDURE    FOLLOW-UP CARE   The procedure you have had should have been relatively painless since the banding of the area involved does not have nerve endings and there is no pain sensation.  The rubber band cuts off the blood supply to the hemorrhoid and the band may fall off as soon as 48 hours after the banding (the band may occasionally be seen in the toilet bowl following a bowel movement). You may notice a temporary feeling of fullness in the rectum which should respond adequately to plain Tylenol or Motrin.  Following the banding, avoid strenuous exercise that evening and resume full activity the next day.  A sitz bath (soaking in a warm tub) or bidet is soothing, and can be useful for cleansing the area after bowel movements.     To avoid constipation, take two tablespoons of natural wheat bran, natural oat bran, flax, Benefiber or any over the counter fiber supplement and increase  your water intake to 7-8 glasses daily.    Unless you have been prescribed anorectal medication, do not put anything inside your rectum for two weeks: No suppositories, enemas, fingers, etc.  Occasionally, you may have more bleeding than usual after the banding procedure.  This is often from the untreated hemorrhoids rather than the treated one.  Don't be concerned if there is a tablespoon or so of blood.  If there is more blood than this, lie flat with your bottom higher than your head and apply an ice pack to the area. If the bleeding does not stop within a half an hour or if you feel faint, call our office at [phone] or go to the emergency room.  Problems are not common; however, if there is a substantial amount of bleeding, severe pain, chills, fever or difficulty passing urine (very rare) or other problems, you should call us at [phone] or report to the nearest emergency room.  Do not stay seated continuously for more than 2-3 hours for a day or two after the procedure.  Tighten your buttock muscles 10-15 times every two hours and take 10-15 deep breaths every 1-2 hours.  Do not spend more than a few minutes on the toilet if you cannot empty your bowel; instead re-visit the toilet at a later time.   It was a pleasure to see you today!  Thank you for trusting me with your gastrointestinal care!

## 2022-06-06 ENCOUNTER — Other Ambulatory Visit: Payer: Self-pay | Admitting: Obstetrics & Gynecology

## 2022-06-06 DIAGNOSIS — Z1231 Encounter for screening mammogram for malignant neoplasm of breast: Secondary | ICD-10-CM

## 2022-06-09 ENCOUNTER — Ambulatory Visit
Admission: RE | Admit: 2022-06-09 | Discharge: 2022-06-09 | Disposition: A | Discharge status: Home / Self Care | Payer: BC Managed Care – PPO | Source: Ambulatory Visit | Attending: Obstetrics & Gynecology | Admitting: Obstetrics & Gynecology

## 2022-06-09 DIAGNOSIS — Z1231 Encounter for screening mammogram for malignant neoplasm of breast: Secondary | ICD-10-CM

## 2022-10-12 IMAGING — MG MM DIGITAL SCREENING BILAT W/ TOMO AND CAD
8 series · 9 of 24 positions shown · non-contrast
Comparison: Previous exam(s).

CLINICAL DATA: Screening.

EXAM:
DIGITAL SCREENING BILATERAL MAMMOGRAM WITH TOMOSYNTHESIS AND CAD
TECHNIQUE: Bilateral screening digital craniocaudal and mediolateral oblique
mammograms were obtained. Bilateral screening digital breast
tomosynthesis was performed. The images were evaluated with
computer-aided detection.

[L MLO synth-2D]
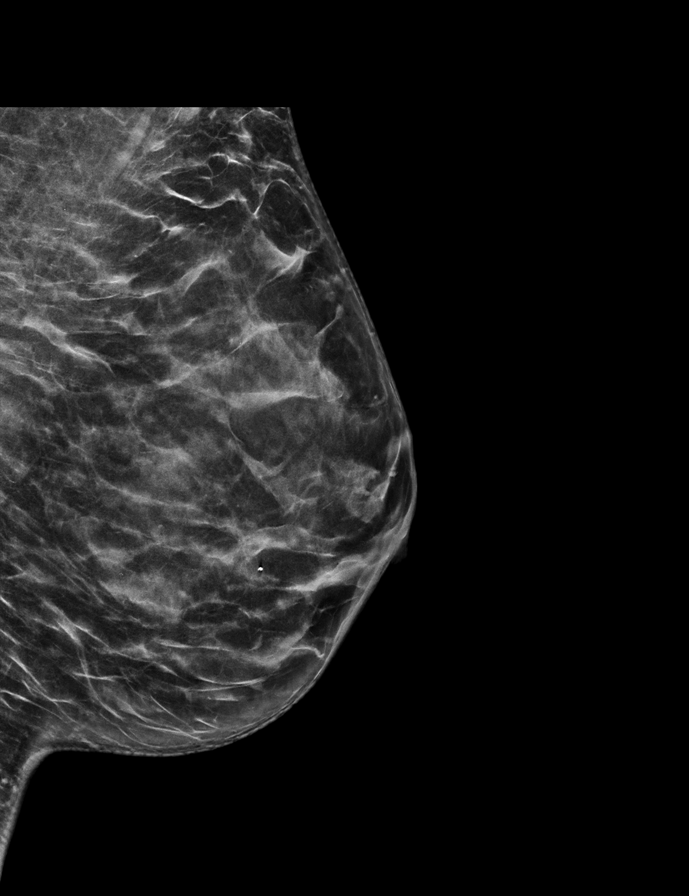

[R CC synth-2D]
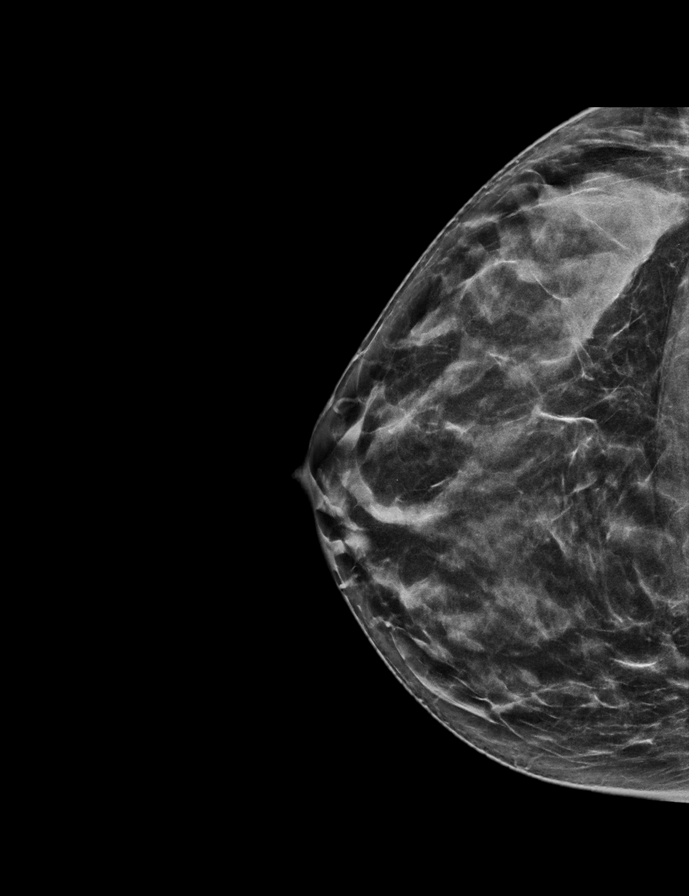

[L CC synth-2D]
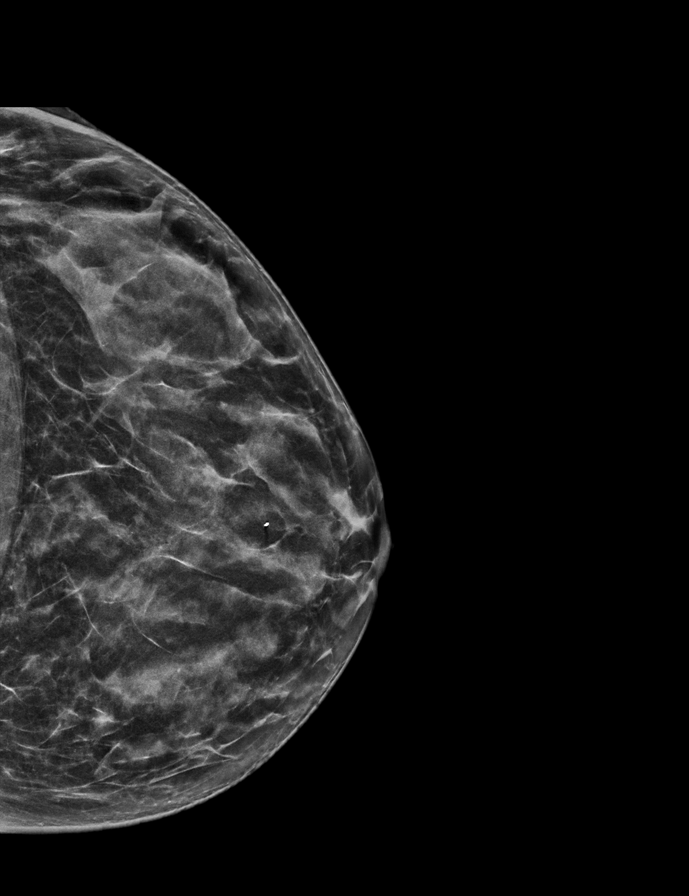

[R MLO synth-2D]
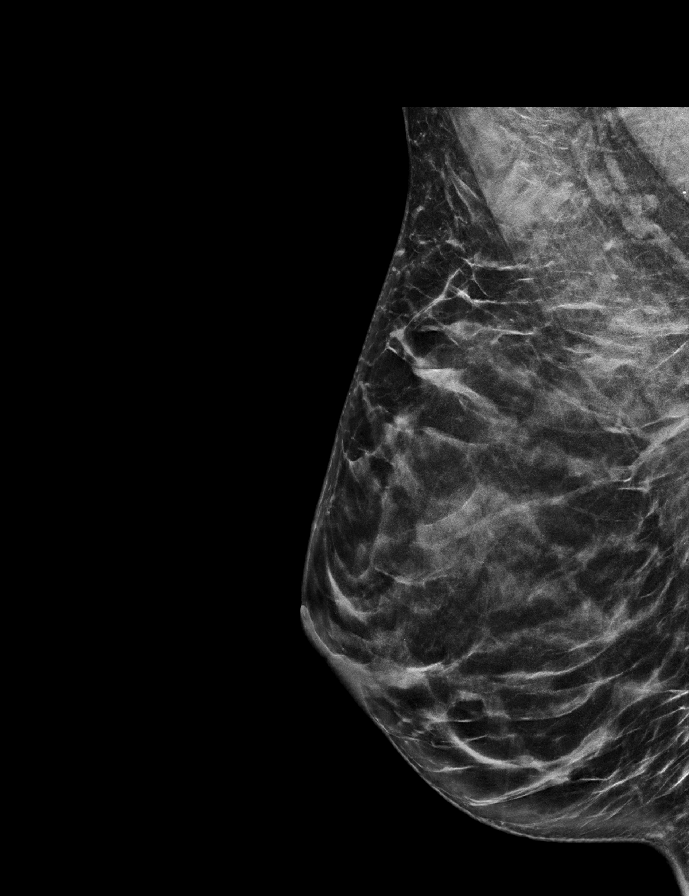

[L CC tomo · 2 of 54 frames shown]
[frame 18/54]
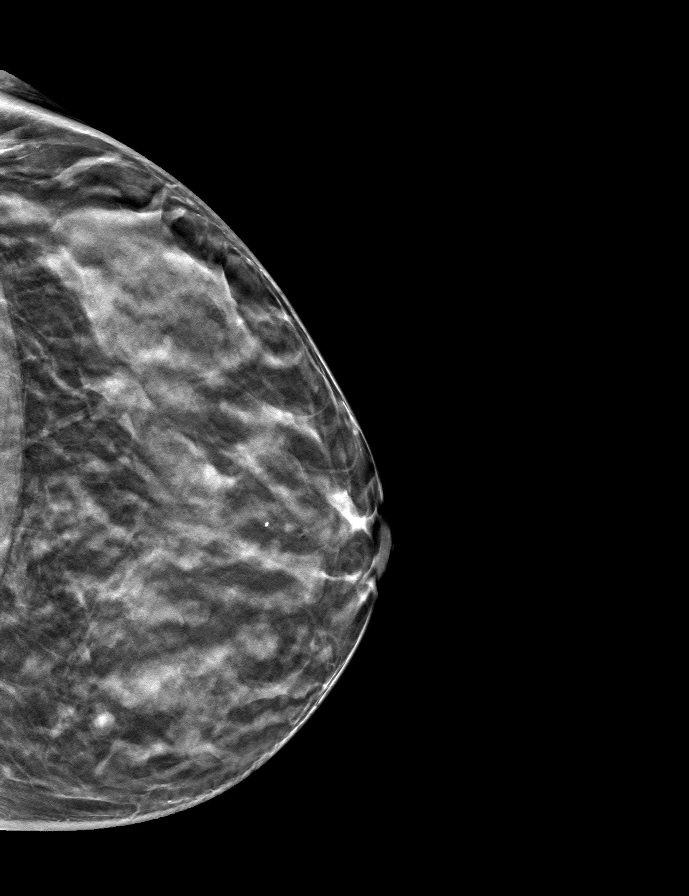
[frame 27/54]
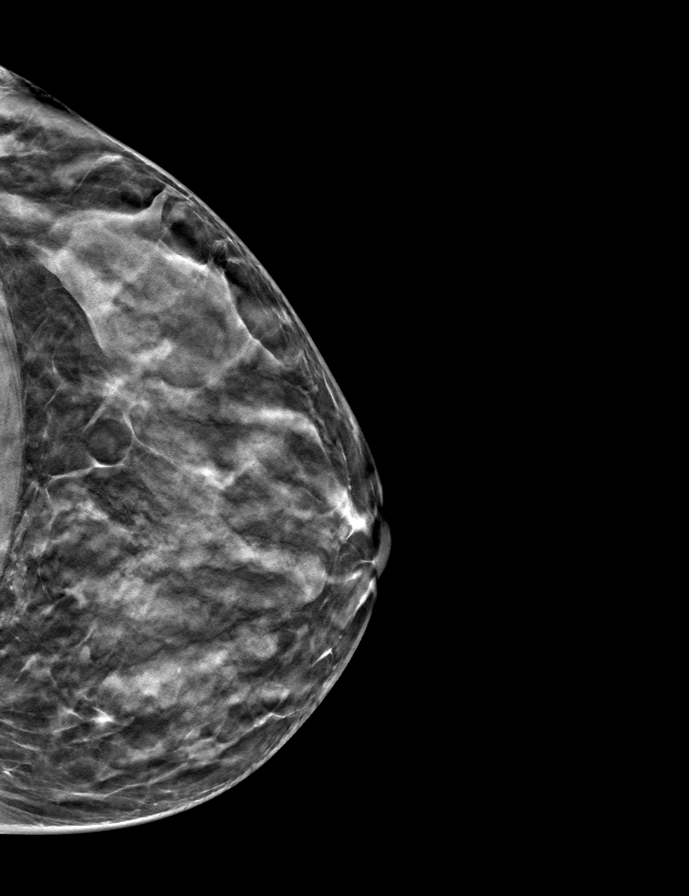

[R CC tomo · tomo slice 29/57.0]
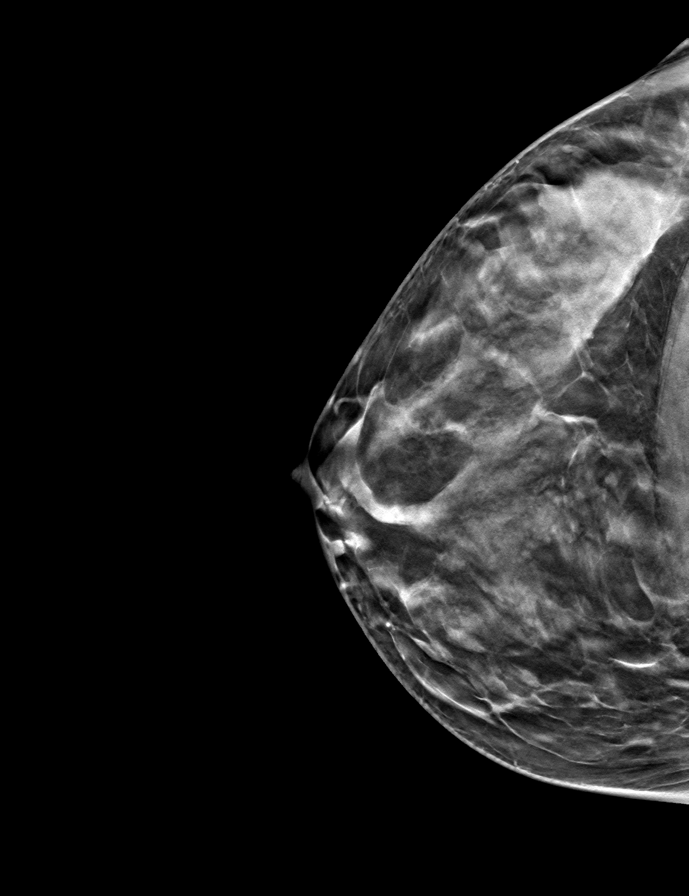

[R MLO tomo · tomo slice 30/59.0]
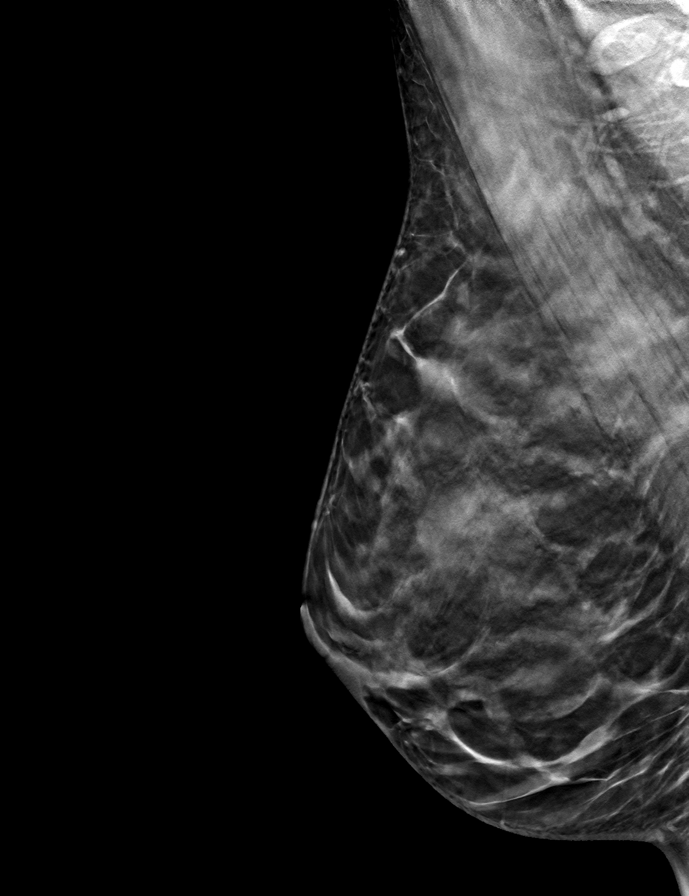

[L MLO tomo · tomo slice 29/57.0]
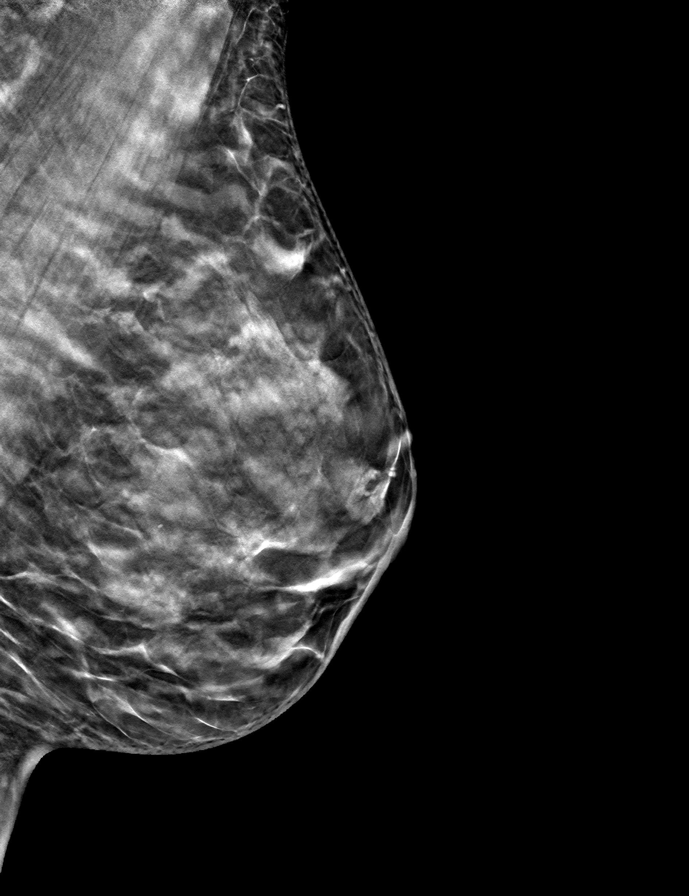

[9 of 24 positions shown; findings below may reference images not displayed]

ACR Breast Density Category c: The breast tissue is heterogeneously
dense, which may obscure small masses.
FINDINGS: There are no findings suspicious for malignancy.
IMPRESSION: No mammographic evidence of malignancy. A result letter of this
screening mammogram will be mailed directly to the patient.

RECOMMENDATION:
Screening mammogram in one year. (Code:Q3-W-BC3)

BI-RADS CATEGORY  1: Negative.

## 2022-10-20 ENCOUNTER — Encounter (HOSPITAL_BASED_OUTPATIENT_CLINIC_OR_DEPARTMENT_OTHER): Payer: Self-pay | Admitting: Obstetrics & Gynecology

## 2022-10-20 ENCOUNTER — Ambulatory Visit (HOSPITAL_BASED_OUTPATIENT_CLINIC_OR_DEPARTMENT_OTHER): Payer: BC Managed Care – PPO | Admitting: Obstetrics & Gynecology

## 2022-10-20 VITALS — BP 112/91 | HR 67 | Ht 64.0 in | Wt 137.0 lb

## 2022-10-20 DIAGNOSIS — Z8249 Family history of ischemic heart disease and other diseases of the circulatory system: Secondary | ICD-10-CM

## 2022-10-20 DIAGNOSIS — Z0000 Encounter for general adult medical examination without abnormal findings: Secondary | ICD-10-CM

## 2022-10-20 DIAGNOSIS — Z01419 Encounter for gynecological examination (general) (routine) without abnormal findings: Secondary | ICD-10-CM

## 2022-10-20 DIAGNOSIS — R5382 Chronic fatigue, unspecified: Secondary | ICD-10-CM

## 2022-10-20 DIAGNOSIS — Z124 Encounter for screening for malignant neoplasm of cervix: Secondary | ICD-10-CM

## 2022-10-20 NOTE — Progress Notes (Addendum)
52 y.o. [id number] Single White or Caucasian female here for annual exam.  Cycles are very infrequent.  Last bleeding was in July and was very short.  She has had some hot flashes but this has improved.  She has noticed when she eats sugar, she will have more issues.  She is hot at night.  Sleep is not great but this is not new.  She not currently SA so not sure about dryness.       Sexually active: No Smoker:  no  Health Maintenance: Pap:  01/27/2021 with neg HR HPV and 12/19/2019 neg with neg HR HPV History of abnormal Pap:  h/o HPV MMG:  06/10/2022 Colonoscopy:  05/2016 BMD:   guidelines reviewed Screening Labs: orders   reports that she has never smoked. She has never used smokeless tobacco. She reports that she does not currently use alcohol. She reports that she does not use drugs.  Past Medical History:  Diagnosis Date   Abnormal Pap smear of cervix 2016   HPV +     History reviewed. No pertinent surgical history.  No current outpatient medications on file.   No current facility-administered medications for this visit.    Family History  Problem Relation Age of Onset   Heart disease Mother    Hypertension Mother    Thyroid disease Mother    Colon cancer Neg Hx    Colon polyps Neg Hx     ROS: Constitutional: negative Genitourinary:negative  Exam:   BP (!) 112/91 (BP Location: Right Arm, Patient Position: Sitting, Cuff Size: Normal)   Pulse 67   Ht 5\' 4"  (1.626 m)   Wt 137 lb (62.1 kg)   LMP 08/01/2022 (Approximate)   BMI 23.52 kg/m   Height: 5\' 4"  (162.6 cm)  General appearance: alert, cooperative and appears stated age Head: Normocephalic, without obvious abnormality, atraumatic Neck: no adenopathy, supple, symmetrical, trachea midline and thyroid normal to inspection and palpation Lungs: clear to auscultation bilaterally Breasts: normal appearance, no masses or tenderness Heart: regular rate and rhythm Abdomen: soft, non-tender; bowel sounds normal; no masses,   no organomegaly Extremities: extremities normal, atraumatic, no cyanosis or edema Skin: Skin color, texture, turgor normal. No rashes or lesions Lymph nodes: Cervical, supraclavicular, and axillary nodes normal. No abnormal inguinal nodes palpated Neurologic: Grossly normal   Pelvic: External genitalia:  no lesions              Urethra:  normal appearing urethra with no masses, tenderness or lesions              Bartholins and Skenes: normal                 Vagina: normal appearing vagina with normal color and no discharge, no lesions              Cervix: no lesions              Pap taken: Yes.   Bimanual Exam:  Uterus:  normal size, contour, position, consistency, mobility, non-tender              Adnexa: normal adnexa and no mass, fullness, tenderness               Rectovaginal: Confirms               Anus:  normal sphincter tone, no lesions  Chaperone, Hendricks Milo, CMA, was present for exam.  Assessment/Plan: 1. Well woman exam with routine gynecological exam - Pap smear obtained  today and held.  Pt wants this done but wants to check with insurance about cost  (pt called on 10/31 and indicated she did not want the pap smear run this year).  It up to date per guidelines.   - Mammogram 05/2022 - Colonoscopy 2018 - Bone mineral density not indicated - lab work done ordered - vaccines reviewed/updated  2. Chronic fatigue - TSH - VITAMIN D 25 Hydroxy (Vit-D Deficiency, Fractures) - B12  3. Family history of hypertension - Lipid panel - Hemoglobin A1c - Comprehensive metabolic panel  4. Blood tests for routine general physical examination

## 2022-10-21 LAB — TSH: TSH: 1.15 u[IU]/mL (ref 0.450–4.500)

## 2022-10-21 LAB — COMPREHENSIVE METABOLIC PANEL
ALT: 12 [IU]/L (ref 0–32)
AST: 22 [IU]/L (ref 0–40)
Albumin: 4.5 g/dL (ref 3.8–4.9)
Alkaline Phosphatase: 56 [IU]/L (ref 44–121)
BUN/Creatinine Ratio: 14 (ref 9–23)
BUN: 12 mg/dL (ref 6–24)
Bilirubin Total: 0.4 mg/dL (ref 0.0–1.2)
CO2: 22 mmol/L (ref 20–29)
Calcium: 9.5 mg/dL (ref 8.7–10.2)
Chloride: 104 mmol/L (ref 96–106)
Creatinine, Ser: 0.84 mg/dL (ref 0.57–1.00)
Globulin, Total: 2.9 g/dL (ref 1.5–4.5)
Glucose: 90 mg/dL (ref 70–99)
Potassium: 4 mmol/L (ref 3.5–5.2)
Sodium: 142 mmol/L (ref 134–144)
Total Protein: 7.4 g/dL (ref 6.0–8.5)
eGFR: 84 mL/min/{1.73_m2} (ref >59)

## 2022-10-21 LAB — HEMOGLOBIN A1C
Est. average glucose Bld gHb Est-mCnc: 103 mg/dL
Hgb A1c MFr Bld: 5.2 % (ref 4.8–5.6)

## 2022-10-21 LAB — LIPID PANEL
Chol/HDL Ratio: 2.1 {ratio} (ref 0.0–4.4)
Cholesterol, Total: 184 mg/dL (ref 100–199)
HDL: 89 mg/dL (ref >39)
LDL Chol Calc (NIH): 86 mg/dL (ref 0–99)
Triglycerides: 47 mg/dL (ref 0–149)
VLDL Cholesterol Cal: 9 mg/dL (ref 5–40)

## 2022-10-21 LAB — VITAMIN B12: Vitamin B-12: 835 pg/mL (ref 232–1245)

## 2022-10-21 LAB — VITAMIN D 25 HYDROXY (VIT D DEFICIENCY, FRACTURES): Vit D, 25-Hydroxy: 29.4 ng/mL — ABNORMAL LOW (ref 30.0–100.0)

## 2022-11-11 NOTE — Addendum Note (Signed)
Addended by: Jerene Bears on: 11/11/2022 12:24 PM   Modules accepted: Orders

## 2022-11-14 ENCOUNTER — Telehealth (HOSPITAL_BASED_OUTPATIENT_CLINIC_OR_DEPARTMENT_OTHER): Payer: Self-pay | Admitting: *Deleted

## 2022-11-14 NOTE — Telephone Encounter (Signed)
Called pt to ask if she had contacted insurance company regarding PAP. Pt states that she forgot and will call now and get back to [place]

## 2022-11-18 NOTE — Addendum Note (Signed)
Addended by: Jerene Bears on: 11/18/2022 11:51 AM   Modules accepted: Orders

## 2022-12-05 DIAGNOSIS — L570 Actinic keratosis: Secondary | ICD-10-CM | POA: Diagnosis not present

## 2022-12-05 DIAGNOSIS — D2262 Melanocytic nevi of left upper limb, including shoulder: Secondary | ICD-10-CM | POA: Diagnosis not present

## 2022-12-08 DIAGNOSIS — J358 Other chronic diseases of tonsils and adenoids: Secondary | ICD-10-CM | POA: Diagnosis not present

## 2023-12-29 DIAGNOSIS — K118 Other diseases of salivary glands: Secondary | ICD-10-CM | POA: Diagnosis not present
